# Patient Record
Sex: Female | Born: 1995 | Race: White | Hispanic: No | Marital: Single | Smoking: Never smoker
Health system: Southern US, Community
[De-identification: ages and names within clinical notes are randomized; demographics above are authoritative.]

## PROBLEM LIST (undated history)

## (undated) ENCOUNTER — Emergency Department (HOSPITAL_BASED_OUTPATIENT_CLINIC_OR_DEPARTMENT_OTHER): Admission: EM | Payer: Self-pay | Source: Home / Self Care

## (undated) HISTORY — PX: WISDOM TOOTH EXTRACTION: SHX21

---
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
## 2014-07-29 ENCOUNTER — Emergency Department (HOSPITAL_BASED_OUTPATIENT_CLINIC_OR_DEPARTMENT_OTHER)
Admission: EM | Admit: 2014-07-29 | Discharge: 2014-07-29 | Disposition: A | Discharge status: Home / Self Care | Payer: BC Managed Care – PPO | Attending: Emergency Medicine | Admitting: Emergency Medicine

## 2014-07-29 ENCOUNTER — Encounter (HOSPITAL_BASED_OUTPATIENT_CLINIC_OR_DEPARTMENT_OTHER): Payer: Self-pay | Admitting: *Deleted

## 2014-07-29 DIAGNOSIS — Z3202 Encounter for pregnancy test, result negative: Secondary | ICD-10-CM | POA: Insufficient documentation

## 2014-07-29 DIAGNOSIS — N390 Urinary tract infection, site not specified: Secondary | ICD-10-CM | POA: Insufficient documentation

## 2014-07-29 DIAGNOSIS — R1013 Epigastric pain: Secondary | ICD-10-CM

## 2014-07-29 DIAGNOSIS — R52 Pain, unspecified: Secondary | ICD-10-CM | POA: Diagnosis present

## 2014-07-29 LAB — COMPREHENSIVE METABOLIC PANEL
ALBUMIN: 3.6 g/dL (ref 3.5–5.2)
ALT: 16 U/L (ref 0–35)
ANION GAP: 13 (ref 5–15)
AST: 16 U/L (ref 0–37)
Alkaline Phosphatase: 62 U/L (ref 39–117)
BILIRUBIN TOTAL: 0.4 mg/dL (ref 0.3–1.2)
BUN: 12 mg/dL (ref 6–23)
CALCIUM: 9.2 mg/dL (ref 8.4–10.5)
CHLORIDE: 101 meq/L (ref 96–112)
CO2: 23 mEq/L (ref 19–32)
CREATININE: 0.7 mg/dL (ref 0.50–1.10)
GFR calc Af Amer: >90 mL/min (ref >90)
GFR calc non Af Amer: >90 mL/min (ref >90)
Glucose, Bld: 127 mg/dL — ABNORMAL HIGH (ref 70–99)
Potassium: 3.9 mEq/L (ref 3.7–5.3)
Sodium: 137 mEq/L (ref 137–147)
Total Protein: 7.6 g/dL (ref 6.0–8.3)

## 2014-07-29 LAB — URINALYSIS, ROUTINE W REFLEX MICROSCOPIC
Glucose, UA: NEGATIVE mg/dL
HGB URINE DIPSTICK: NEGATIVE
Ketones, ur: NEGATIVE mg/dL
Nitrite: NEGATIVE
PROTEIN: NEGATIVE mg/dL
Specific Gravity, Urine: 1.034 — ABNORMAL HIGH (ref 1.005–1.030)
UROBILINOGEN UA: 1 mg/dL (ref 0.0–1.0)
pH: 6 (ref 5.0–8.0)

## 2014-07-29 LAB — CBC WITH DIFFERENTIAL/PLATELET
BASOS PCT: 0 % (ref 0–1)
Basophils Absolute: 0 10*3/uL (ref 0.0–0.1)
EOS ABS: 0.1 10*3/uL (ref 0.0–0.7)
EOS PCT: 1 % (ref 0–5)
HEMATOCRIT: 40.7 % (ref 36.0–46.0)
HEMOGLOBIN: 14.3 g/dL (ref 12.0–15.0)
Lymphocytes Relative: 11 % — ABNORMAL LOW (ref 12–46)
Lymphs Abs: 1 10*3/uL (ref 0.7–4.0)
MCH: 32.6 pg (ref 26.0–34.0)
MCHC: 35.1 g/dL (ref 30.0–36.0)
MCV: 92.7 fL (ref 78.0–100.0)
MONO ABS: 0.7 10*3/uL (ref 0.1–1.0)
MONOS PCT: 8 % (ref 3–12)
NEUTROS ABS: 7.1 10*3/uL (ref 1.7–7.7)
Neutrophils Relative %: 80 % — ABNORMAL HIGH (ref 43–77)
Platelets: 280 10*3/uL (ref 150–400)
RBC: 4.39 MIL/uL (ref 3.87–5.11)
RDW: 11.7 % (ref 11.5–15.5)
WBC: 8.8 10*3/uL (ref 4.0–10.5)

## 2014-07-29 LAB — URINE MICROSCOPIC-ADD ON

## 2014-07-29 LAB — LIPASE, BLOOD: LIPASE: 20 U/L (ref 11–59)

## 2014-07-29 LAB — PREGNANCY, URINE: PREG TEST UR: NEGATIVE

## 2014-07-29 MED ORDER — PANTOPRAZOLE SODIUM 40 MG PO TBEC: 40.0000 mg | DELAYED_RELEASE_TABLET | Freq: Every day | ORAL | Status: AC

## 2014-07-29 MED ORDER — PANTOPRAZOLE SODIUM 40 MG PO TBEC
40.0000 mg | DELAYED_RELEASE_TABLET | Freq: Once | ORAL | Status: AC
  Administered 2014-07-29: 40 mg via ORAL
  Filled 2014-07-29: qty 1

## 2014-07-29 MED ORDER — NITROFURANTOIN MONOHYD MACRO 100 MG PO CAPS: 100.0000 mg | ORAL_CAPSULE | Freq: Two times a day (BID) | ORAL | Status: AC

## 2014-07-29 MED ORDER — NITROFURANTOIN MONOHYD MACRO 100 MG PO CAPS
100.0000 mg | ORAL_CAPSULE | Freq: Once | ORAL | Status: AC
  Administered 2014-07-29: 100 mg via ORAL
  Filled 2014-07-29: qty 1

## 2014-07-29 MED ORDER — ONDANSETRON 8 MG PO TBDP
8.0000 mg | ORAL_TABLET | Freq: Once | ORAL | Status: AC
  Administered 2014-07-29: 8 mg via ORAL
  Filled 2014-07-29: qty 1

## 2014-07-29 MED ORDER — GI COCKTAIL ~~LOC~~
30.0000 mL | Freq: Once | ORAL | Status: AC
  Administered 2014-07-29: 30 mL via ORAL
  Filled 2014-07-29: qty 30

## 2014-07-29 NOTE — ED Notes (Signed)
Patient is resting comfortably. 

## 2014-07-29 NOTE — ED Notes (Signed)
Pt attempting urine sample at this time

## 2014-07-29 NOTE — ED Provider Notes (Signed)
CSN: [id number]     Arrival date & time 07/29/14  [id number] History   First MD Initiated Contact with Patient 07/29/14 [phone]     Chief Complaint  Patient presents with  . Generalized Body Aches     (Consider location/radiation/quality/duration/timing/severity/associated sxs/prior Treatment) The history is provided by the patient.   18 year old female had onset yesterday afternoon of chills and nausea with associated migratory aches which have been in various places in her abdomen and mid and lower back. She rates pain at 5/10. It is worse when she lays down but nothing makes it better. She has not had any vomiting. There's been no constipation or diarrhea. She denies urinary urgency, frequency, tenesmus, dysuria. She has not taken anything for pain.  History reviewed. No pertinent past medical history. Past Surgical History  Procedure Laterality Date  . Wisdom tooth extraction     No family history on file. History  Substance Use Topics  . Smoking status: Never Smoker   . Smokeless tobacco: Not on file  . Alcohol Use: No   OB History    No data available     Review of Systems  All other systems reviewed and are negative.     Allergies  Review of patient's allergies indicates no known allergies.  Home Medications   Prior to Admission medications   Not on File   BP 128/79 mmHg  Pulse 93  Temp(Src) 99 F (37.2 C)  Resp 18  Ht 5\' 3"  (1.6 m)  Wt 190 lb (86.183 kg)  BMI 33.67 kg/m2  SpO2 100%  LMP 07/23/2014 (Approximate) Physical Exam  Nursing note and vitals reviewed.  Obese 18 year old female, resting comfortably and in no acute distress. Vital signs are normal. Oxygen saturation is 100%, which is normal. Head is normocephalic and atraumatic. PERRLA, EOMI. Oropharynx is clear. Neck is nontender and supple without adenopathy or JVD. Back is nontender and there is no CVA tenderness. Lungs are clear without rales, wheezes, or rhonchi. Chest is nontender. Heart has  regular rate and rhythm without murmur. Abdomen is soft, flat, nontender without masses or hepatosplenomegaly and peristalsis is hypoactive. Extremities have no cyanosis or edema, full range of motion is present. Skin is warm and dry without rash. Neurologic: Mental status is normal, cranial nerves are intact, there are no motor or sensory deficits.  ED Course  Procedures (including critical care time) Labs Review Results for orders placed or performed during the hospital encounter of 07/29/14  CBC with Differential  Result Value Ref Range   WBC 8.8 4.0 - 10.5 K/uL   RBC 4.39 3.87 - 5.11 MIL/uL   Hemoglobin 14.3 12.0 - 15.0 g/dL   HCT 47.840.7 29.536.0 - 62.146.0 %   MCV 92.7 78.0 - 100.0 fL   MCH 32.6 26.0 - 34.0 pg   MCHC 35.1 30.0 - 36.0 g/dL   RDW 30.811.7 65.711.5 - 84.615.5 %   Platelets 280 150 - 400 K/uL   Neutrophils Relative % 80 (H) 43 - 77 %   Neutro Abs 7.1 1.7 - 7.7 K/uL   Lymphocytes Relative 11 (L) 12 - 46 %   Lymphs Abs 1.0 0.7 - 4.0 K/uL   Monocytes Relative 8 3 - 12 %   Monocytes Absolute 0.7 0.1 - 1.0 K/uL   Eosinophils Relative 1 0 - 5 %   Eosinophils Absolute 0.1 0.0 - 0.7 K/uL   Basophils Relative 0 0 - 1 %   Basophils Absolute 0.0 0.0 - 0.1 K/uL  Comprehensive  metabolic panel  Result Value Ref Range   Sodium 137 137 - 147 mEq/L   Potassium 3.9 3.7 - 5.3 mEq/L   Chloride 101 96 - 112 mEq/L   CO2 23 19 - 32 mEq/L   Glucose, Bld 127 (H) 70 - 99 mg/dL   BUN 12 6 - 23 mg/dL   Creatinine, Ser 4.090.70 0.50 - 1.10 mg/dL   Calcium 9.2 8.4 - 81.110.5 mg/dL   Total Protein 7.6 6.0 - 8.3 g/dL   Albumin 3.6 3.5 - 5.2 g/dL   AST 16 0 - 37 U/L   ALT 16 0 - 35 U/L   Alkaline Phosphatase 62 39 - 117 U/L   Total Bilirubin 0.4 0.3 - 1.2 mg/dL   GFR calc non Af Amer >90 >90 mL/min   GFR calc Af Amer >90 >90 mL/min   Anion gap 13 5 - 15  Lipase, blood  Result Value Ref Range   Lipase 20 11 - 59 U/L  Pregnancy, urine  Result Value Ref Range   Preg Test, Ur NEGATIVE NEGATIVE  Urinalysis,  Routine w reflex microscopic  Result Value Ref Range   Color, Urine AMBER (A) YELLOW   APPearance CLOUDY (A) CLEAR   Specific Gravity, Urine 1.034 (H) 1.005 - 1.030   pH 6.0 5.0 - 8.0   Glucose, UA NEGATIVE NEGATIVE mg/dL   Hgb urine dipstick NEGATIVE NEGATIVE   Bilirubin Urine SMALL (A) NEGATIVE   Ketones, ur NEGATIVE NEGATIVE mg/dL   Protein, ur NEGATIVE NEGATIVE mg/dL   Urobilinogen, UA 1.0 0.0 - 1.0 mg/dL   Nitrite NEGATIVE NEGATIVE   Leukocytes, UA SMALL (A) NEGATIVE  Urine microscopic-add on  Result Value Ref Range   Squamous Epithelial / LPF RARE RARE   WBC, UA 7-10 <3 WBC/hpf   RBC / HPF 0-2 <3 RBC/hpf   Bacteria, UA MANY (A) RARE   Urine-Other MUCOUS PRESENT    MDM   Final diagnoses:  Urinary tract infection without hematuria, site unspecified  Epigastric pain    Abdominal pain and nausea which may represent a viral gastritis. No signs on exam of the biliary tract disease. She'll be given a dose of ondansetron for nausea and will be given a therapeutic trial of a GI cocktail as well as oral pantoprazole. Screening labs are obtained. Old records are reviewed and she has no relevant past visits.  She feels significantly better after above noted treatment. Laboratory workup is significant for mildly elevated blood sugar. Patient is advised of this finding and she will have it monitored closely as an outpatient. Also, urinalysis has 7-10 WBCs and many bacteria. It is unclear whether this is truly a urinary tract infection or asymptomatic bacteriuria. Because she is having some symptoms that may be referable to urinary tract, she will be treated and she is given a prescription for nitrofurantoin. Urine cultures been sent, and she is advised to have urinalysis repeated after completing the antibiotic to ensure adequate treatment. She is also being given a prescription for pantoprazole to treat possible gastroesophageal reflux disease. She is to follow-up with her PCP in 3 weeks for  reevaluation. Return if symptoms worsen.  Dione Boozeavid Idona Stach, MD 07/29/14 [phone]

## 2014-07-29 NOTE — ED Notes (Addendum)
Pt c/o of body aches and chills that started yesterday. Has not taken temp at home. Denies any other symptoms. States she has been nauseated but denies at present. Denies diarrhea. Has not had a flu shot this year. Denies sore throat or urinary symptoms. Denies resp symptoms.

## 2014-07-29 NOTE — Discharge Instructions (Signed)
Your evaluation showed mildly elevated blood sugar. This needs to be followed closely to make sure that she were not developing diabetes. Your abdominal tenderness may be related to acid reflux. You're being started on pantoprazole to treat this. Urinalysis suggested a urinary tract infection and you're being placed on an antibiotic for this. Please have your urine rechecked after completing the course of antibiotics to make sure that the infection has cleared.  Urinary Tract Infection Urinary tract infections (UTIs) can develop anywhere along your urinary tract. Your urinary tract is your body's drainage system for removing wastes and extra water. Your urinary tract includes two kidneys, two ureters, a bladder, and a urethra. Your kidneys are a pair of bean-shaped organs. Each kidney is about the size of your fist. They are located below your ribs, one on each side of your spine. CAUSES Infections are caused by microbes, which are microscopic organisms, including fungi, viruses, and bacteria. These organisms are so small that they can only be seen through a microscope. Bacteria are the microbes that most commonly cause UTIs. SYMPTOMS  Symptoms of UTIs may vary by age and gender of the patient and by the location of the infection. Symptoms in young women typically include a frequent and intense urge to urinate and a painful, burning feeling in the bladder or urethra during urination. Older women and men are more likely to be tired, shaky, and weak and have muscle aches and abdominal pain. A fever may mean the infection is in your kidneys. Other symptoms of a kidney infection include pain in your back or sides below the ribs, nausea, and vomiting. DIAGNOSIS To diagnose a UTI, your caregiver will ask you about your symptoms. Your caregiver also will ask to provide a urine sample. The urine sample will be tested for bacteria and white blood cells. White blood cells are made by your body to help fight  infection. TREATMENT  Typically, UTIs can be treated with medication. Because most UTIs are caused by a bacterial infection, they usually can be treated with the use of antibiotics. The choice of antibiotic and length of treatment depend on your symptoms and the type of bacteria causing your infection. HOME CARE INSTRUCTIONS  If you were prescribed antibiotics, take them exactly as your caregiver instructs you. Finish the medication even if you feel better after you have only taken some of the medication.  Drink enough water and fluids to keep your urine clear or pale yellow.  Avoid caffeine, tea, and carbonated beverages. They tend to irritate your bladder.  Empty your bladder often. Avoid holding urine for long periods of time.  Empty your bladder before and after sexual intercourse.  After a bowel movement, women should cleanse from front to back. Use each tissue only once. SEEK MEDICAL CARE IF:   You have back pain.  You develop a fever.  Your symptoms do not begin to resolve within 3 days. SEEK IMMEDIATE MEDICAL CARE IF:   You have severe back pain or lower abdominal pain.  You develop chills.  You have nausea or vomiting.  You have continued burning or discomfort with urination. MAKE SURE YOU:   Understand these instructions.  Will watch your condition.  Will get help right away if you are not doing well or get worse. Document Released: 05/27/2005 Document Revised: 02/16/2012 Document Reviewed: 09/25/2011 [facility] Patient Information 2015 [place], [place]. This information is not intended to replace advice given to you by your health care provider. Make sure you discuss any questions  you have with your health care provider.  Gastroesophageal Reflux Disease, Adult Gastroesophageal reflux disease (GERD) happens when acid from your stomach flows up into the esophagus. When acid comes in contact with the esophagus, the acid causes soreness (inflammation) in the esophagus.  Over time, GERD may create small holes (ulcers) in the lining of the esophagus. CAUSES   Increased body weight. This puts pressure on the stomach, making acid rise from the stomach into the esophagus.  Smoking. This increases acid production in the stomach.  Drinking alcohol. This causes decreased pressure in the lower esophageal sphincter (valve or ring of muscle between the esophagus and stomach), allowing acid from the stomach into the esophagus.  Late evening meals and a full stomach. This increases pressure and acid production in the stomach.  A malformed lower esophageal sphincter. Sometimes, no cause is found. SYMPTOMS   Burning pain in the lower part of the mid-chest behind the breastbone and in the mid-stomach area. This may occur twice a week or more often.  Trouble swallowing.  Sore throat.  Dry cough.  Asthma-like symptoms including chest tightness, shortness of breath, or wheezing. DIAGNOSIS  Your caregiver may be able to diagnose GERD based on your symptoms. In some cases, X-rays and other tests may be done to check for complications or to check the condition of your stomach and esophagus. TREATMENT  Your caregiver may recommend over-the-counter or prescription medicines to help decrease acid production. Ask your caregiver before starting or adding any new medicines.  HOME CARE INSTRUCTIONS   Change the factors that you can control. Ask your caregiver for guidance concerning weight loss, quitting smoking, and alcohol consumption.  Avoid foods and drinks that make your symptoms worse, such as:  Caffeine or alcoholic drinks.  Chocolate.  Peppermint or mint flavorings.  Garlic and onions.  Spicy foods.  Citrus fruits, such as oranges, lemons, or limes.  Tomato-based foods such as sauce, chili, salsa, and pizza.  Fried and fatty foods.  Avoid lying down for the 3 hours prior to your bedtime or prior to taking a nap.  Eat small, frequent meals instead of  large meals.  Wear loose-fitting clothing. Do not wear anything tight around your waist that causes pressure on your stomach.  Raise the head of your bed 6 to 8 inches with wood blocks to help you sleep. Extra pillows will not help.  Only take over-the-counter or prescription medicines for pain, discomfort, or fever as directed by your caregiver.  Do not take aspirin, ibuprofen, or other nonsteroidal anti-inflammatory drugs (NSAIDs). SEEK IMMEDIATE MEDICAL CARE IF:   You have pain in your arms, neck, jaw, teeth, or back.  Your pain increases or changes in intensity or duration.  You develop nausea, vomiting, or sweating (diaphoresis).  You develop shortness of breath, or you faint.  Your vomit is green, yellow, black, or looks like coffee grounds or blood.  Your stool is red, bloody, or black. These symptoms could be signs of other problems, such as heart disease, gastric bleeding, or esophageal bleeding. MAKE SURE YOU:   Understand these instructions.  Will watch your condition.  Will get help right away if you are not doing well or get worse. Document Released: 05/27/2005 Document Revised: 11/09/2011 Document Reviewed: 03/06/2011 [facility] Patient Information 2015 [place], [place]. This information is not intended to replace advice given to you by your health care provider. Make sure you discuss any questions you have with your health care provider.  Nitrofurantoin tablets or capsules What is  this medicine? NITROFURANTOIN (nye troe fyoor AN toyn) is an antibiotic. It is used to treat urinary tract infections. This medicine may be used for other purposes; ask your health care provider or pharmacist if you have questions. COMMON BRAND NAME(S): Macrobid, Macrodantin, Urotoin What should I tell my health care provider before I take this medicine? They need to know if you have any of these conditions: -anemia -diabetes -glucose-6-phosphate dehydrogenase deficiency -kidney  disease -liver disease -lung disease -other chronic illness -an unusual or allergic reaction to nitrofurantoin, other antibiotics, other medicines, foods, dyes or preservatives -pregnant or trying to get pregnant -breast-feeding How should I use this medicine? Take this medicine by mouth with a glass of water. Follow the directions on the prescription label. Take this medicine with food or milk. Take your doses at regular intervals. Do not take your medicine more often than directed. Do not stop taking except on your doctor's advice. Talk to your pediatrician regarding the use of this medicine in children. While this drug may be prescribed for selected conditions, precautions do apply. Overdosage: If you think you have taken too much of this medicine contact a poison control center or emergency room at once. NOTE: This medicine is only for you. Do not share this medicine with others. What if I miss a dose? If you miss a dose, take it as soon as you can. If it is almost time for your next dose, take only that dose. Do not take double or extra doses. What may interact with this medicine? -antacids containing magnesium trisilicate -probenecid -quinolone antibiotics like ciprofloxacin, lomefloxacin, norfloxacin and ofloxacin -sulfinpyrazone This list may not describe all possible interactions. Give your health care provider a list of all the medicines, herbs, non-prescription drugs, or dietary supplements you use. Also tell them if you smoke, drink alcohol, or use illegal drugs. Some items may interact with your medicine. What should I watch for while using this medicine? Tell your doctor or health care professional if your symptoms do not improve or if you get new symptoms. Drink several glasses of water a day. If you are taking this medicine for a long time, visit your doctor for regular checks on your progress. If you are diabetic, you may get a false positive result for sugar in your urine with  certain brands of urine tests. Check with your doctor. What side effects may I notice from receiving this medicine? Side effects that you should report to your doctor or health care professional as soon as possible: -allergic reactions like skin rash or hives, swelling of the face, lips, or tongue -chest pain -cough -difficulty breathing -dizziness, drowsiness -fever or infection -joint aches or pains -pale or blue-tinted skin -redness, blistering, peeling or loosening of the skin, including inside the mouth -tingling, burning, pain, or numbness in hands or feet -unusual bleeding or bruising -unusually weak or tired -yellowing of eyes or skin Side effects that usually do not require medical attention (report to your doctor or health care professional if they continue or are bothersome): -dark urine -diarrhea -headache -loss of appetite -nausea or vomiting -temporary hair loss This list may not describe all possible side effects. Call your doctor for medical advice about side effects. You may report side effects to FDA at 1-800-FDA-1088. Where should I keep my medicine? Keep out of the reach of children. Store at room temperature between 15 and 30 degrees C (59 and 86 degrees F). Protect from light. Throw away any unused medicine after the expiration date.  NOTE: This sheet is a summary. It may not cover all possible information. If you have questions about this medicine, talk to your doctor, pharmacist, or health care provider.  2015, [employer]. (2008-03-07 15:56:47)  Pantoprazole tablets What is this medicine? PANTOPRAZOLE (pan TOE pra zole) prevents the production of acid in the stomach. It is used to treat gastroesophageal reflux disease (GERD), inflammation of the esophagus, and Zollinger-Ellison syndrome. This medicine may be used for other purposes; ask your health care provider or pharmacist if you have questions. COMMON BRAND NAME(S): Protonix What should I  tell my health care provider before I take this medicine? They need to know if you have any of these conditions: -liver disease -low levels of magnesium in the blood -an unusual or allergic reaction to omeprazole, lansoprazole, pantoprazole, rabeprazole, other medicines, foods, dyes, or preservatives -pregnant or trying to get pregnant -breast-feeding How should I use this medicine? Take this medicine by mouth. Swallow the tablets whole with a drink of water. Follow the directions on the prescription label. Do not crush, break, or chew. Take your medicine at regular intervals. Do not take your medicine more often than directed. Talk to your pediatrician regarding the use of this medicine in children. While this drug may be prescribed for children as young as 5 years for selected conditions, precautions do apply. Overdosage: If you think you have taken too much of this medicine contact a poison control center or emergency room at once. NOTE: This medicine is only for you. Do not share this medicine with others. What if I miss a dose? If you miss a dose, take it as soon as you can. If it is almost time for your next dose, take only that dose. Do not take double or extra doses. What may interact with this medicine? Do not take this medicine with any of the following medications: -atazanavir -nelfinavir This medicine may also interact with the following medications: -ampicillin -delavirdine -digoxin -diuretics -iron salts -medicines for fungal infections like ketoconazole, itraconazole and voriconazole -warfarin This list may not describe all possible interactions. Give your health care provider a list of all the medicines, herbs, non-prescription drugs, or dietary supplements you use. Also tell them if you smoke, drink alcohol, or use illegal drugs. Some items may interact with your medicine. What should I watch for while using this medicine? It can take several days before your stomach pain  gets better. Check with your doctor or health care professional if your condition does not start to get better, or if it gets worse. You may need blood work done while you are taking this medicine. What side effects may I notice from receiving this medicine? Side effects that you should report to your doctor or health care professional as soon as possible: -allergic reactions like skin rash, itching or hives, swelling of the face, lips, or tongue -bone, muscle or joint pain -breathing problems -chest pain or chest tightness -dark yellow or brown urine -dizziness -fast, irregular heartbeat -feeling faint or lightheaded -fever or sore throat -muscle spasm -palpitations -redness, blistering, peeling or loosening of the skin, including inside the mouth -seizures -tremors -unusual bleeding or bruising -unusually weak or tired -yellowing of the eyes or skin Side effects that usually do not require medical attention (Report these to your doctor or health care professional if they continue or are bothersome.): -constipation -diarrhea -dry mouth -headache -nausea This list may not describe all possible side effects. Call your doctor for medical advice about side effects. You  may report side effects to FDA at 1-800-FDA-1088. Where should I keep my medicine? Keep out of the reach of children. Store at room temperature between 15 and 30 degrees C (59 and 86 degrees F). Protect from light and moisture. Throw away any unused medicine after the expiration date. NOTE: This sheet is a summary. It may not cover all possible information. If you have questions about this medicine, talk to your doctor, pharmacist, or health care provider.  2015, [employer] Standard. (2012-06-15 16:40:16)

## 2014-07-29 NOTE — ED Notes (Signed)
Family at bedside. 

## 2014-07-29 NOTE — ED Notes (Signed)
MD with pt  

## 2014-07-31 LAB — URINE CULTURE: Colony Count: 50000
